# Patient Record
Sex: Female | Born: 1955 | Race: White | Hispanic: No | Marital: Married | Smoking: Never smoker
Health system: Southern US, Community
[De-identification: ages and names within clinical notes are randomized; demographics above are authoritative.]

## PROBLEM LIST (undated history)

## (undated) DIAGNOSIS — F419 Anxiety disorder, unspecified: Secondary | ICD-10-CM

## (undated) DIAGNOSIS — T7840XA Allergy, unspecified, initial encounter: Secondary | ICD-10-CM

## (undated) DIAGNOSIS — M1990 Unspecified osteoarthritis, unspecified site: Secondary | ICD-10-CM

## (undated) HISTORY — PX: KNEE ARTHROSCOPY: SUR90

## (undated) HISTORY — DX: Allergy, unspecified, initial encounter: T78.40XA

---

## 2006-06-01 ENCOUNTER — Encounter: Admission: RE | Admit: 2006-06-01 | Discharge: 2006-06-01 | Payer: Self-pay | Admitting: Internal Medicine

## 2007-06-03 ENCOUNTER — Encounter: Admission: RE | Admit: 2007-06-03 | Discharge: 2007-06-03 | Payer: Self-pay | Admitting: Family Medicine

## 2009-04-23 ENCOUNTER — Encounter: Admission: RE | Admit: 2009-04-23 | Discharge: 2009-04-23 | Payer: Self-pay | Admitting: Family Medicine

## 2009-12-31 ENCOUNTER — Ambulatory Visit: Payer: Self-pay | Admitting: Diagnostic Radiology

## 2009-12-31 ENCOUNTER — Encounter: Payer: Self-pay | Admitting: Emergency Medicine

## 2009-12-31 ENCOUNTER — Observation Stay (HOSPITAL_COMMUNITY)
Admission: EM | Admit: 2009-12-31 | Discharge: 2010-01-01 | Payer: Self-pay | Source: Home / Self Care | Admitting: Family Medicine

## 2009-12-31 ENCOUNTER — Ambulatory Visit: Payer: Self-pay | Admitting: Cardiology

## 2009-12-31 ENCOUNTER — Ambulatory Visit: Payer: Self-pay | Admitting: Family Medicine

## 2010-01-01 ENCOUNTER — Encounter: Payer: Self-pay | Admitting: Family Medicine

## 2010-01-01 ENCOUNTER — Ambulatory Visit: Payer: Self-pay | Admitting: Oncology

## 2010-06-03 ENCOUNTER — Encounter: Admission: RE | Admit: 2010-06-03 | Discharge: 2010-06-03 | Payer: Self-pay | Admitting: Family Medicine

## 2010-09-14 LAB — CBC
HCT: 34.8 % — ABNORMAL LOW (ref 36.0–46.0)
HCT: 37.2 % (ref 36.0–46.0)
MCH: 29.5 pg (ref 26.0–34.0)
MCHC: 34.5 g/dL (ref 30.0–36.0)
MCV: 89.4 fL (ref 78.0–100.0)
RBC: 4.16 MIL/uL (ref 3.87–5.11)
RDW: 13.6 % (ref 11.5–15.5)
WBC: 11.9 10*3/uL — ABNORMAL HIGH (ref 4.0–10.5)

## 2010-09-14 LAB — BASIC METABOLIC PANEL
CO2: 26 mEq/L (ref 19–32)
Chloride: 106 mEq/L (ref 96–112)
GFR calc Af Amer: >60 mL/min (ref >60)
Potassium: 4.1 mEq/L (ref 3.5–5.1)

## 2010-09-14 LAB — CARDIAC PANEL(CRET KIN+CKTOT+MB+TROPI)
CK, MB: 0.8 ng/mL (ref 0.3–4.0)
Relative Index: INVALID (ref 0.0–2.5)
Total CK: 56 U/L (ref 7–177)
Troponin I: 0.01 ng/mL (ref 0.00–0.06)

## 2010-09-14 LAB — TSH: TSH: 1.926 u[IU]/mL (ref 0.350–4.500)

## 2010-09-14 LAB — COMPREHENSIVE METABOLIC PANEL
ALT: 21 U/L (ref 0–35)
BUN: 8 mg/dL (ref 6–23)
Calcium: 8.4 mg/dL (ref 8.4–10.5)
Glucose, Bld: 98 mg/dL (ref 70–99)
Sodium: 141 mEq/L (ref 135–145)
Total Protein: 6.9 g/dL (ref 6.0–8.3)

## 2010-09-14 LAB — URINALYSIS, ROUTINE W REFLEX MICROSCOPIC
Bilirubin Urine: NEGATIVE
Glucose, UA: NEGATIVE mg/dL
Ketones, ur: NEGATIVE mg/dL
Leukocytes, UA: NEGATIVE
pH: 5 (ref 5.0–8.0)

## 2010-09-14 LAB — URINE MICROSCOPIC-ADD ON

## 2010-09-14 LAB — DIFFERENTIAL
Eosinophils Relative: 2 % (ref 0–5)
Lymphocytes Relative: 24 % (ref 12–46)
Lymphs Abs: 2.9 10*3/uL (ref 0.7–4.0)
Monocytes Absolute: 0.9 10*3/uL (ref 0.1–1.0)
Monocytes Relative: 8 % (ref 3–12)

## 2010-09-14 LAB — HEMOGLOBIN A1C
Hgb A1c MFr Bld: 5.8 % — ABNORMAL HIGH (ref <5.7)
Mean Plasma Glucose: 120 mg/dL — ABNORMAL HIGH (ref <117)

## 2010-09-14 LAB — LIPID PANEL
LDL Cholesterol: 107 mg/dL — ABNORMAL HIGH (ref 0–99)
Triglycerides: 65 mg/dL (ref <150)

## 2013-07-31 ENCOUNTER — Ambulatory Visit (INDEPENDENT_AMBULATORY_CARE_PROVIDER_SITE_OTHER): Payer: BC Managed Care – PPO | Admitting: Family Medicine

## 2013-07-31 VITALS — BP 144/82 | HR 90 | Temp 98.2°F | Resp 18 | Ht 62.0 in | Wt 222.0 lb

## 2013-07-31 DIAGNOSIS — F411 Generalized anxiety disorder: Secondary | ICD-10-CM

## 2013-07-31 DIAGNOSIS — IMO0001 Reserved for inherently not codable concepts without codable children: Secondary | ICD-10-CM

## 2013-07-31 DIAGNOSIS — R030 Elevated blood-pressure reading, without diagnosis of hypertension: Secondary | ICD-10-CM

## 2013-07-31 DIAGNOSIS — R002 Palpitations: Secondary | ICD-10-CM

## 2013-07-31 MED ORDER — ALPRAZOLAM 0.25 MG PO TABS: 0.2500 mg | ORAL_TABLET | Freq: Two times a day (BID) | ORAL | Status: DC | PRN

## 2013-07-31 NOTE — Patient Instructions (Signed)

## 2013-07-31 NOTE — Progress Notes (Signed)
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  Under stress lately at work, divorced on second marriage, 3 children (conflicts with current husband).  No h/o hypertension or palpitations previously.  No focal weakness  Objective:  Alert, relaxed and in NAD.  Tearful when talking about family.  Excited about new responsibilities at work BP 130/86 Moving 4 extremities normal Neck:  Supple Chest:  Clear Heart: reg without murmur or gallop Skin:  No rash  Assessment:  Anxiety, worried about family  Plan:  Palpitations  Elevated blood pressure  Anxiety state, unspecified - Plan: ALPRAZolam (XANAX) 0.25 MG tablet  Xanax .25 bid prn #60 no refill Follow up with PCP on Wednesday.  Dr. Zachery DauerBarnes  Signed, Elvina SidleKurt Nivan Melendrez, MD

## 2013-08-02 ENCOUNTER — Other Ambulatory Visit (HOSPITAL_COMMUNITY)
Admission: RE | Admit: 2013-08-02 | Discharge: 2013-08-02 | Disposition: A | Discharge status: Home / Self Care | Payer: BC Managed Care – PPO | Source: Ambulatory Visit | Attending: Family Medicine | Admitting: Family Medicine

## 2013-08-02 ENCOUNTER — Other Ambulatory Visit: Payer: Self-pay | Admitting: Family Medicine

## 2013-08-02 DIAGNOSIS — Z124 Encounter for screening for malignant neoplasm of cervix: Secondary | ICD-10-CM | POA: Insufficient documentation

## 2013-10-12 ENCOUNTER — Ambulatory Visit: Payer: BC Managed Care – PPO

## 2013-10-12 ENCOUNTER — Ambulatory Visit (INDEPENDENT_AMBULATORY_CARE_PROVIDER_SITE_OTHER): Payer: BC Managed Care – PPO | Admitting: Emergency Medicine

## 2013-10-12 VITALS — BP 140/82 | HR 90 | Temp 98.6°F | Resp 16 | Ht 62.0 in | Wt 224.0 lb

## 2013-10-12 DIAGNOSIS — M25569 Pain in unspecified knee: Secondary | ICD-10-CM

## 2013-10-12 LAB — POCT CBC
GRANULOCYTE PERCENT: 64 % (ref 37–80)
HEMATOCRIT: 44.6 % (ref 37.7–47.9)
HEMOGLOBIN: 14.7 g/dL (ref 12.2–16.2)
Lymph, poc: 3.4 (ref 0.6–3.4)
MCH, POC: 29.6 pg (ref 27–31.2)
MCHC: 33 g/dL (ref 31.8–35.4)
MCV: 90 fL (ref 80–97)
MID (cbc): 0.7 (ref 0–0.9)
MPV: 9.1 fL (ref 0–99.8)
POC GRANULOCYTE: 7.3 — AB (ref 2–6.9)
POC LYMPH PERCENT: 29.7 %L (ref 10–50)
POC MID %: 6.3 % (ref 0–12)
Platelet Count, POC: 413 10*3/uL (ref 142–424)
RBC: 4.96 M/uL (ref 4.04–5.48)
RDW, POC: 13.5 %
WBC: 11.4 10*3/uL — AB (ref 4.6–10.2)

## 2013-10-12 LAB — POCT SEDIMENTATION RATE: POCT SED RATE: 63 mm/h — AB (ref 0–22)

## 2013-10-12 MED ORDER — MELOXICAM 15 MG PO TABS: 15.0000 mg | ORAL_TABLET | Freq: Every day | ORAL | Status: DC

## 2013-10-12 NOTE — Progress Notes (Signed)
[facility] [address], [place] [id number] [phone]  Date:  10/12/2013   Name:  Brenda Bishop   DOB:  Aug 03, 1955   MRN:  [id number]  PCP:  Dois DavenportICHTER,KAREN L., MD    Chief Complaint: Leg Pain   History of Present Illness:  Brenda Bishop is a 58 y.o. very pleasant female patient who presents with the following:  1 week duration pain in legs mostly knee.  Left knee hurts less.  No history of injury or overuse.  No history of DJD or arthritis.  No fever or chills.  No redness or swelling of knees.  No crepitus or locking. Started on zoloft and aggressively increased to 100 mg daily before joint pain started but no antecedent infections.  No improvement with over the counter medications or other home remedies. Denies other complaint or health concern today.   There are no active problems to display for this patient.   Past Medical History  Diagnosis Date  . Allergy     Past Surgical History  Procedure Laterality Date  . Cesarean section      History  Substance Use Topics  . Smoking status: Never Smoker   . Smokeless tobacco: Not on file  . Alcohol Use: Not on file    Family History  Problem Relation Age of Onset  . Cancer Mother   . COPD Mother   . Diabetes Father   . Heart disease Father   . Hyperlipidemia Father   . Hypertension Father   . Stroke Father     Allergies  Allergen Reactions  . Penicillins     Medication list has been reviewed and updated.  Current Outpatient Prescriptions on File Prior to Visit  Medication Sig Dispense Refill  . ALPRAZolam (XANAX) 0.25 MG tablet Take 1 tablet (0.25 mg total) by mouth 2 (two) times daily as needed for anxiety.  60 tablet  0   No current facility-administered medications on file prior to visit.    Review of Systems:  As per HPI, otherwise negative.    Physical Examination: Filed Vitals:   10/12/13 1313  BP: 140/82  Pulse: 90  Temp: 98.6 F (37 C)  Resp: 16   Filed Vitals:    10/12/13 1313  Height: 5\' 2"  (1.575 m)  Weight: 224 lb (101.606 kg)   Body mass index is 40.96 kg/(m^2). Ideal Body Weight: Weight in (lb) to have BMI = 25: 136.4  GEN: WDWN, NAD, Non-toxic, Alert & Oriented x 3 HEENT: Atraumatic, Normocephalic.  Ears and Nose: No external deformity. EXTR: No clubbing/cyanosis/edema NEURO: Normal gait.  PSYCH: Normally interactive. Conversant. Not depressed or anxious appearing.  Calm demeanor.  KNEE:  Mild tenderness and warmth bilaterally full ROM.  No cellulitis or erythema.    Assessment and Plan:  DJD knees mobic  Signed,  Phillips OdorJeffery Amelia Macken, MD   Results for orders placed in visit on 10/12/13  POCT CBC      Result Value Ref Range   WBC 11.4 (*) 4.6 - 10.2 K/uL   Lymph, poc 3.4  0.6 - 3.4   POC LYMPH PERCENT 29.7  10 - 50 %L   MID (cbc) 0.7  0 - 0.9   POC MID % 6.3  0 - 12 %M   POC Granulocyte 7.3 (*) 2 - 6.9   Granulocyte percent 64.0  37 - 80 %G   RBC 4.96  4.04 - 5.48 M/uL   Hemoglobin 14.7  12.2 - 16.2 g/dL  HCT, POC 44.6  37.7 - 47.9 %   MCV 90.0  80 - 97 fL   MCH, POC 29.6  27 - 31.2 pg   MCHC 33.0  31.8 - 35.4 g/dL   RDW, POC 16.113.5     Platelet Count, POC 413  142 - 424 K/uL   MPV 9.1  0 - 99.8 fL    [facility] reading (PRIMARY) by  Dr. Dareen PianoAnderson.  Negative but for mild DJD.

## 2013-10-16 ENCOUNTER — Telehealth: Payer: Self-pay

## 2013-10-16 NOTE — Telephone Encounter (Signed)
Patient is needing her x-rays by 2:30 today

## 2013-10-16 NOTE — Telephone Encounter (Addendum)
Patient called stated she has an appointment today to see the orthopedics at 3:15. She is requesting a copy of her x-ray. [phone].( Knee)

## 2013-10-16 NOTE — Telephone Encounter (Signed)
Copy available in pu drawer- pt notified.

## 2014-11-26 ENCOUNTER — Encounter (HOSPITAL_COMMUNITY): Payer: Self-pay

## 2014-11-26 ENCOUNTER — Emergency Department (HOSPITAL_COMMUNITY)
Admission: EM | Admit: 2014-11-26 | Discharge: 2014-11-26 | Disposition: A | Discharge status: Home / Self Care | Payer: BLUE CROSS/BLUE SHIELD | Attending: Emergency Medicine | Admitting: Emergency Medicine

## 2014-11-26 ENCOUNTER — Emergency Department (HOSPITAL_COMMUNITY): Payer: BLUE CROSS/BLUE SHIELD

## 2014-11-26 DIAGNOSIS — Z79899 Other long term (current) drug therapy: Secondary | ICD-10-CM | POA: Insufficient documentation

## 2014-11-26 DIAGNOSIS — R002 Palpitations: Secondary | ICD-10-CM | POA: Insufficient documentation

## 2014-11-26 DIAGNOSIS — F419 Anxiety disorder, unspecified: Secondary | ICD-10-CM | POA: Diagnosis not present

## 2014-11-26 DIAGNOSIS — Z880 Allergy status to penicillin: Secondary | ICD-10-CM | POA: Diagnosis not present

## 2014-11-26 LAB — CBC WITH DIFFERENTIAL/PLATELET
Basophils Absolute: 0.1 10*3/uL (ref 0.0–0.1)
Basophils Relative: 1 % (ref 0–1)
Eosinophils Absolute: 0.3 10*3/uL (ref 0.0–0.7)
Eosinophils Relative: 3 % (ref 0–5)
HEMATOCRIT: 37.4 % (ref 36.0–46.0)
HEMOGLOBIN: 12.3 g/dL (ref 12.0–15.0)
LYMPHS PCT: 26 % (ref 12–46)
Lymphs Abs: 3.2 10*3/uL (ref 0.7–4.0)
MCH: 29.2 pg (ref 26.0–34.0)
MCHC: 32.9 g/dL (ref 30.0–36.0)
MCV: 88.8 fL (ref 78.0–100.0)
MONOS PCT: 10 % (ref 3–12)
Monocytes Absolute: 1.2 10*3/uL — ABNORMAL HIGH (ref 0.1–1.0)
NEUTROS ABS: 7.5 10*3/uL (ref 1.7–7.7)
NEUTROS PCT: 60 % (ref 43–77)
PLATELETS: 318 10*3/uL (ref 150–400)
RBC: 4.21 MIL/uL (ref 3.87–5.11)
RDW: 13.5 % (ref 11.5–15.5)
WBC: 12.3 10*3/uL — AB (ref 4.0–10.5)

## 2014-11-26 LAB — I-STAT TROPONIN, ED: Troponin i, poc: 0 ng/mL (ref 0.00–0.08)

## 2014-11-26 LAB — I-STAT CHEM 8, ED
BUN: 13 mg/dL (ref 6–20)
Calcium, Ion: 1.09 mmol/L — ABNORMAL LOW (ref 1.12–1.23)
Chloride: 105 mmol/L (ref 101–111)
Creatinine, Ser: 0.8 mg/dL (ref 0.44–1.00)
Glucose, Bld: 103 mg/dL — ABNORMAL HIGH (ref 65–99)
HCT: 39 % (ref 36.0–46.0)
Hemoglobin: 13.3 g/dL (ref 12.0–15.0)
Potassium: 3.6 mmol/L (ref 3.5–5.1)
Sodium: 140 mmol/L (ref 135–145)
TCO2: 22 mmol/L (ref 0–100)

## 2014-11-26 NOTE — ED Notes (Signed)
MD at bedside. 

## 2014-11-26 NOTE — Discharge Instructions (Signed)

## 2014-11-26 NOTE — ED Provider Notes (Signed)
CSN: [id number]     Arrival date & time 11/26/14  1644 History   First MD Initiated Contact with Patient 11/26/14 1750     Chief Complaint  Patient presents with  . Palpitations     (Consider location/radiation/quality/duration/timing/severity/associated sxs/prior Treatment) The history is provided by the patient.   patient had a feeling of palpitations today. Began while she was at work. She has had some stress at work. Did not feel lightheaded or dizzy. States she was more aware of her eating. States that another nurse felt her pulse and thought she may need to come to the ER. No chest pain. No trouble breathing. No numbness weakness. No weight loss. No previous history of thyroid problems. No heavy caffeine or stimulant intake. She says she has had some similar episodes in the past was told that it was stress.  Past Medical History  Diagnosis Date  . Allergy    Past Surgical History  Procedure Laterality Date  . Cesarean section    . Knee arthroscopy Left    Family History  Problem Relation Age of Onset  . Cancer Mother   . COPD Mother   . Diabetes Father   . Heart disease Father   . Hyperlipidemia Father   . Hypertension Father   . Stroke Father    History  Substance Use Topics  . Smoking status: Never Smoker   . Smokeless tobacco: Not on file  . Alcohol Use: Yes     Comment: occ   OB History    No data available     Review of Systems  Constitutional: Negative for activity change and appetite change.  Eyes: Negative for pain.  Respiratory: Negative for chest tightness and shortness of breath.   Cardiovascular: Positive for palpitations. Negative for chest pain and leg swelling.  Gastrointestinal: Negative for nausea, vomiting, abdominal pain and diarrhea.  Genitourinary: Negative for flank pain.  Musculoskeletal: Negative for back pain and neck stiffness.  Skin: Negative for rash.  Neurological: Negative for weakness, numbness and headaches.   Psychiatric/Behavioral: Negative for behavioral problems. The patient is nervous/anxious.       Allergies  Penicillins  Home Medications   Prior to Admission medications   Medication Sig Start Date End Date Taking? Authorizing Provider  diphenhydramine-acetaminophen (TYLENOL PM) 25-500 MG TABS Take 1 tablet by mouth at bedtime as needed (sleep).   Yes Historical Provider, MD  etodolac (LODINE) 400 MG tablet Take 400 mg by mouth daily.   Yes Historical Provider, MD  LORazepam (ATIVAN) 0.5 MG tablet Take 0.25-0.5 mg by mouth every 4 (four) hours as needed for anxiety.  11/15/14  Yes Historical Provider, MD  ALPRAZolam Prudy Feeler) 0.25 MG tablet Take 1 tablet (0.25 mg total) by mouth 2 (two) times daily as needed for anxiety. Patient not taking: Reported on 11/26/2014 07/31/13   Elvina Sidle, MD  meloxicam (MOBIC) 15 MG tablet Take 1 tablet (15 mg total) by mouth daily. Patient not taking: Reported on 11/26/2014 10/12/13   Carmelina Dane, MD   BP 138/77 mmHg  Pulse 77  Temp(Src) 98.1 F (36.7 C) (Oral)  Resp 18  SpO2 95% Physical Exam  Constitutional: She is oriented to person, place, and time. She appears well-developed and well-nourished.  HENT:  Head: Normocephalic and atraumatic.  Eyes: Pupils are equal, round, and reactive to light.  Neck: Normal range of motion. Neck supple. No thyromegaly present.  Cardiovascular: Normal rate, regular rhythm and normal heart sounds.   No murmur heard. Pulmonary/Chest: Effort  normal and breath sounds normal. No respiratory distress. She has no wheezes. She has no rales.  Abdominal: Soft. Bowel sounds are normal. She exhibits no distension. There is no tenderness. There is no rebound and no guarding.  Musculoskeletal: Normal range of motion.  Neurological: She is alert and oriented to person, place, and time. No cranial nerve deficit.  Skin: Skin is warm and dry.  Psychiatric: She has a normal mood and affect. Her speech is normal.  Nursing  note and vitals reviewed.   ED Course  Procedures (including critical care time) Labs Review Labs Reviewed  CBC WITH DIFFERENTIAL/PLATELET - Abnormal; Notable for the following:    WBC 12.3 (*)    Monocytes Absolute 1.2 (*)    All other components within normal limits  I-STAT CHEM 8, ED - Abnormal; Notable for the following:    Glucose, Bld 103 (*)    Calcium, Ion 1.09 (*)    All other components within normal limits  Rosezena SensorI-STAT TROPOININ, ED    Imaging Review Dg Chest 2 View  11/26/2014   CLINICAL DATA:  Palpitations.  EXAM: CHEST  2 VIEW  COMPARISON:  None.  FINDINGS: The heart size and mediastinal contours are within normal limits. Both lungs are clear. The visualized skeletal structures are unremarkable.  IMPRESSION: No active cardiopulmonary disease.   Electronically Signed   By: Elberta Fortisaniel  Boyle M.D.   On: 11/26/2014 18:38     EKG Interpretation   Date/Time:  Monday Nov 26 2014 17:02:13 EDT Ventricular Rate:  97 PR Interval:  144 QRS Duration: 86 QT Interval:  354 QTC Calculation: 450 R Axis:   17 Text Interpretation:  Sinus rhythm Probable left atrial enlargement RSR'  in V1 or V2, probably normal variant No significant change since last  tracing Confirmed by Rubin PayorPICKERING  MD, Harrold DonathNATHAN [phone]([id number] on 11/26/2014 6:00:36 PM      MDM   Final diagnoses:  Palpitations    Patient with palpitations. Sinus here. No PVCs. Lab work reassuring and will need follow-up with her PCP or cardiology.    Benjiman CoreNathan Raheem Kolbe, MD 11/27/14 [phone]

## 2014-11-26 NOTE — ED Notes (Signed)
Pt c/o palpitations starting this morning while she was at work.  Denies pain.  Denies n/v/d.  Pt reports similar episode previously.  Sts "I just chalked it up to stress."

## 2015-06-04 ENCOUNTER — Other Ambulatory Visit: Payer: Self-pay

## 2015-06-04 DIAGNOSIS — Z1231 Encounter for screening mammogram for malignant neoplasm of breast: Secondary | ICD-10-CM

## 2015-06-12 ENCOUNTER — Ambulatory Visit: Payer: TRICARE For Life (TFL) | Admitting: Allergy and Immunology

## 2015-07-02 ENCOUNTER — Ambulatory Visit

## 2015-09-27 ENCOUNTER — Other Ambulatory Visit (HOSPITAL_COMMUNITY)
Admission: RE | Admit: 2015-09-27 | Discharge: 2015-09-27 | Disposition: A | Discharge status: Home / Self Care | Source: Ambulatory Visit | Attending: Family Medicine | Admitting: Family Medicine

## 2015-09-27 ENCOUNTER — Other Ambulatory Visit: Payer: Self-pay | Admitting: Family Medicine

## 2015-09-27 DIAGNOSIS — Z1151 Encounter for screening for human papillomavirus (HPV): Secondary | ICD-10-CM | POA: Diagnosis not present

## 2015-09-27 DIAGNOSIS — Z01419 Encounter for gynecological examination (general) (routine) without abnormal findings: Secondary | ICD-10-CM | POA: Insufficient documentation

## 2015-09-30 LAB — CYTOLOGY - PAP

## 2015-09-30 IMAGING — CR DG CHEST 2V
2 series · 2 of 2 positions shown · non-contrast
Comparison: None.

CLINICAL DATA: Palpitations.

EXAM:
CHEST  2 VIEW

[w chest pa]
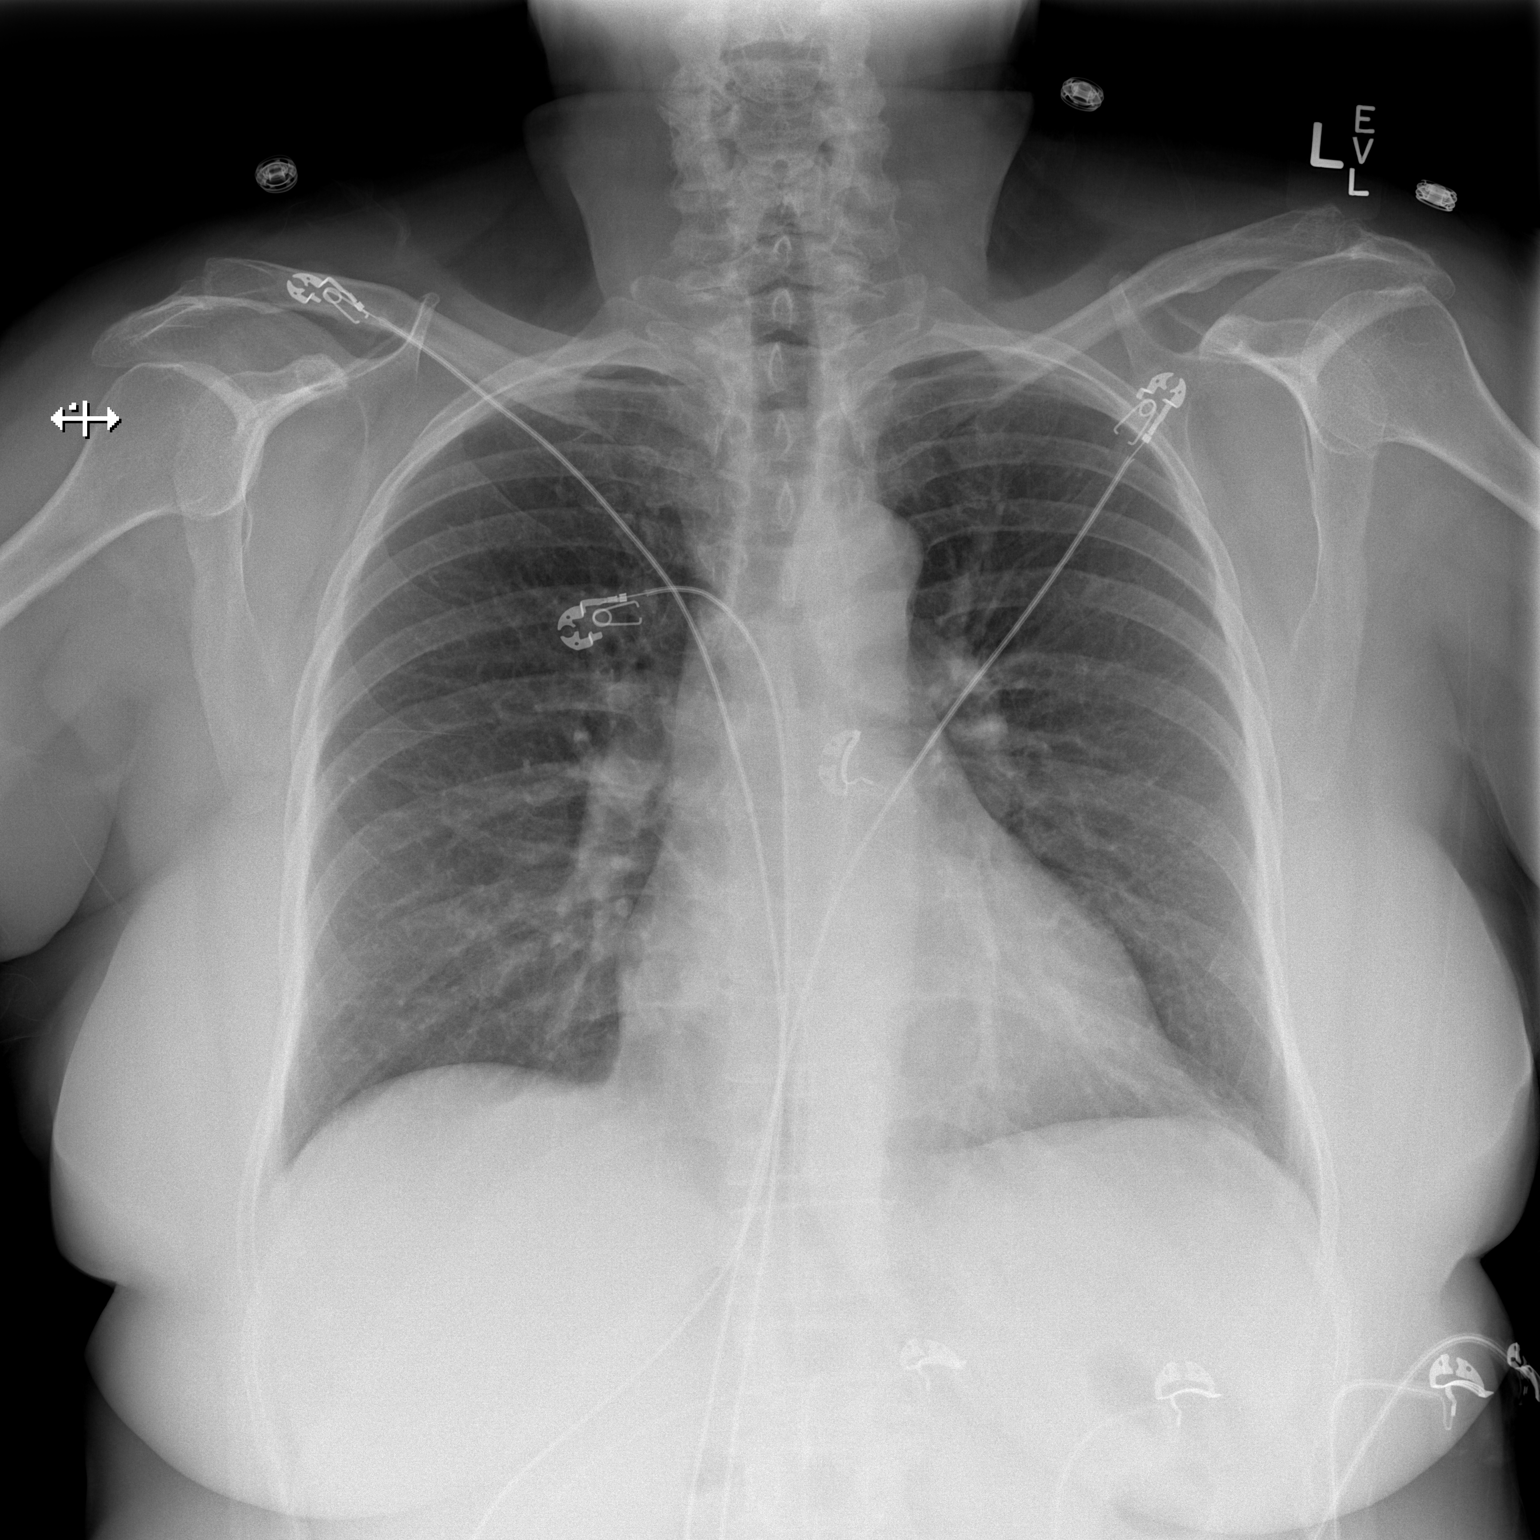

[w chest lat]
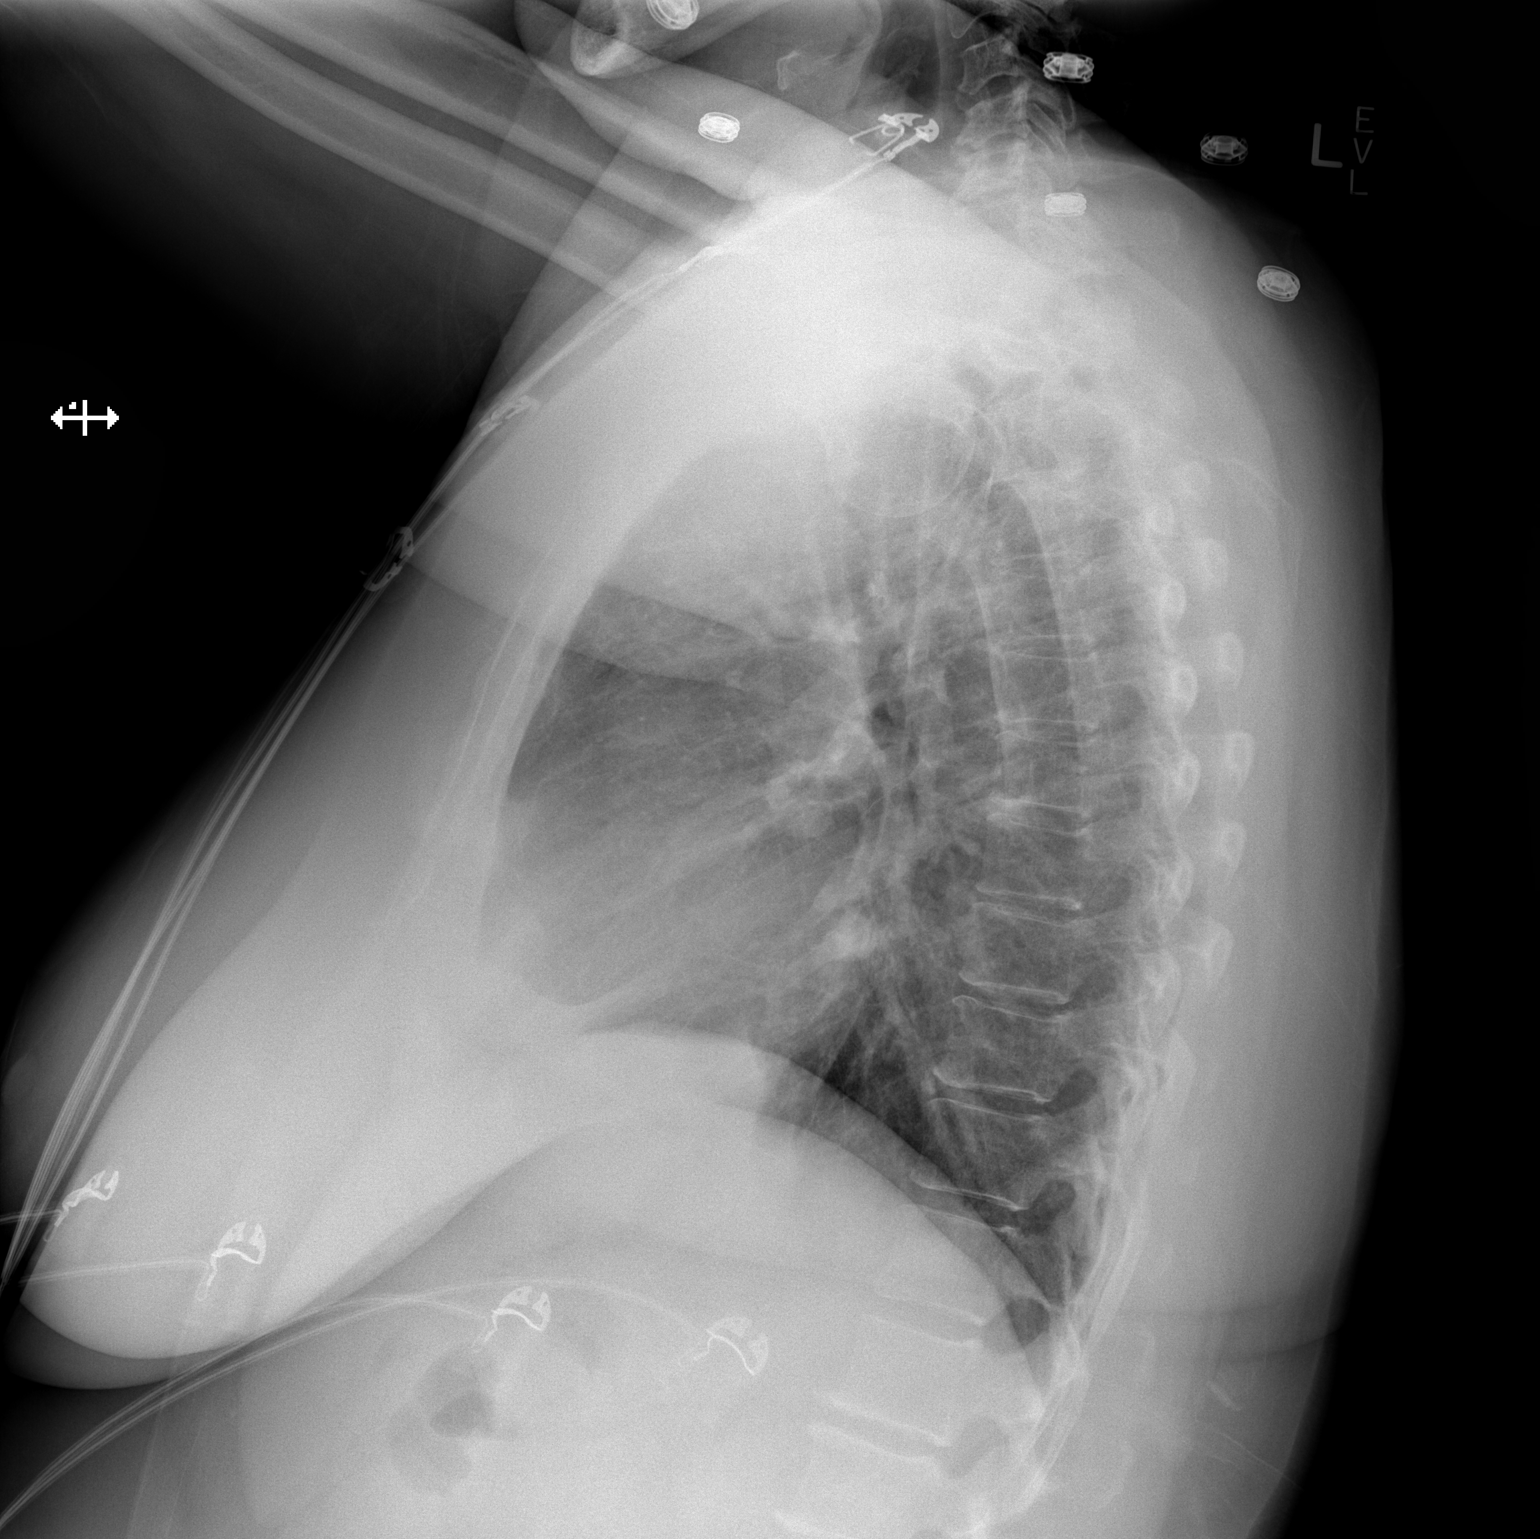

[2 of 2 positions shown; findings below may reference images not displayed]

FINDINGS: The heart size and mediastinal contours are within normal limits.
Both lungs are clear. The visualized skeletal structures are
unremarkable.
IMPRESSION: No active cardiopulmonary disease.

## 2015-12-23 ENCOUNTER — Other Ambulatory Visit: Payer: Self-pay | Admitting: Family Medicine

## 2015-12-23 DIAGNOSIS — Z1231 Encounter for screening mammogram for malignant neoplasm of breast: Secondary | ICD-10-CM

## 2015-12-24 ENCOUNTER — Ambulatory Visit
Admission: RE | Admit: 2015-12-24 | Discharge: 2015-12-24 | Disposition: A | Discharge status: Home / Self Care | Source: Ambulatory Visit | Attending: Family Medicine | Admitting: Family Medicine

## 2015-12-24 ENCOUNTER — Other Ambulatory Visit: Payer: Self-pay | Admitting: Family Medicine

## 2015-12-24 DIAGNOSIS — Z1231 Encounter for screening mammogram for malignant neoplasm of breast: Secondary | ICD-10-CM

## 2015-12-30 ENCOUNTER — Other Ambulatory Visit: Payer: Self-pay | Admitting: Family Medicine

## 2015-12-30 DIAGNOSIS — R928 Other abnormal and inconclusive findings on diagnostic imaging of breast: Secondary | ICD-10-CM

## 2016-01-08 ENCOUNTER — Encounter

## 2016-01-09 ENCOUNTER — Encounter

## 2016-01-10 ENCOUNTER — Ambulatory Visit
Admission: RE | Admit: 2016-01-10 | Discharge: 2016-01-10 | Disposition: A | Discharge status: Home / Self Care | Source: Ambulatory Visit | Attending: Family Medicine | Admitting: Family Medicine

## 2016-01-10 DIAGNOSIS — R928 Other abnormal and inconclusive findings on diagnostic imaging of breast: Secondary | ICD-10-CM

## 2016-07-31 ENCOUNTER — Other Ambulatory Visit: Payer: Self-pay | Admitting: Family Medicine

## 2016-07-31 DIAGNOSIS — R921 Mammographic calcification found on diagnostic imaging of breast: Secondary | ICD-10-CM

## 2016-08-11 ENCOUNTER — Encounter

## 2016-08-25 ENCOUNTER — Ambulatory Visit
Admission: RE | Admit: 2016-08-25 | Discharge: 2016-08-25 | Disposition: A | Discharge status: Home / Self Care | Source: Ambulatory Visit | Attending: Family Medicine | Admitting: Family Medicine

## 2016-08-25 DIAGNOSIS — R921 Mammographic calcification found on diagnostic imaging of breast: Secondary | ICD-10-CM

## 2016-12-10 ENCOUNTER — Other Ambulatory Visit: Payer: Self-pay | Admitting: Nurse Practitioner

## 2017-01-26 ENCOUNTER — Other Ambulatory Visit: Payer: Self-pay | Admitting: Obstetrics and Gynecology

## 2017-02-11 NOTE — Patient Instructions (Addendum)
Your procedure is scheduled on: Tuesday February 16, 2017 at 7:30 am  [facility] at: 6 am   Pick up the phone at the desk and dial [phone].  Call this number if you have problems the morning of surgery: [phone].  Remember: Do NOT eat food or drink any fluids after: Midnight on Sunday August 20 Do NOT drink clear liquids after: Take these medicines the morning of surgery with a SIP OF WATER: None  Do NOT wear jewelry (body piercing), metal hair clips/bobby pins, make-up, or nail polish. Do NOT wear lotions, powders, or perfumes.  You may wear deoderant. Do NOT shave for 48 hours prior to surgery. Do NOT bring valuables to the hospital. Contacts, dLentures, or bridgework may not be worn into surgery.  Have a responsible adult drive you home and stay with you for 24 hours after your procedure

## 2017-02-12 ENCOUNTER — Encounter (HOSPITAL_COMMUNITY): Payer: Self-pay

## 2017-02-12 ENCOUNTER — Other Ambulatory Visit: Payer: Self-pay

## 2017-02-12 ENCOUNTER — Encounter (HOSPITAL_COMMUNITY)
Admission: RE | Admit: 2017-02-12 | Discharge: 2017-02-12 | Disposition: A | Discharge status: Home / Self Care | Source: Ambulatory Visit | Attending: Obstetrics and Gynecology | Admitting: Obstetrics and Gynecology

## 2017-02-12 DIAGNOSIS — Z029 Encounter for administrative examinations, unspecified: Secondary | ICD-10-CM | POA: Diagnosis not present

## 2017-02-12 HISTORY — DX: Anxiety disorder, unspecified: F41.9

## 2017-02-12 HISTORY — DX: Unspecified osteoarthritis, unspecified site: M19.90

## 2017-02-12 LAB — CBC
HEMATOCRIT: 40.2 % (ref 36.0–46.0)
Hemoglobin: 13.1 g/dL (ref 12.0–15.0)
MCH: 29.2 pg (ref 26.0–34.0)
MCHC: 32.6 g/dL (ref 30.0–36.0)
MCV: 89.5 fL (ref 78.0–100.0)
Platelets: 347 10*3/uL (ref 150–400)
RBC: 4.49 MIL/uL (ref 3.87–5.11)
RDW: 14.4 % (ref 11.5–15.5)
WBC: 11.4 10*3/uL — AB (ref 4.0–10.5)

## 2017-02-12 NOTE — Pre-Procedure Instructions (Signed)
DR. Sandford Craze VIEWED EKG AND OKAY'D

## 2017-02-15 NOTE — Anesthesia Preprocedure Evaluation (Addendum)
Anesthesia Evaluation  Patient identified by MRN, date of birth, ID band Patient awake    Reviewed: Allergy & Precautions, NPO status , Patient's Chart, lab work & pertinent test results  Airway Mallampati: III  TM Distance: >3 FB Neck ROM: Full    Dental no notable dental hx. (+) Dental Advisory Given   Pulmonary neg pulmonary ROS,    Pulmonary exam normal        Cardiovascular negative cardio ROS Normal cardiovascular exam     Neuro/Psych Anxiety negative neurological ROS     GI/Hepatic negative GI ROS, Neg liver ROS,   Endo/Other  Morbid obesity  Renal/GU negative Renal ROS     Musculoskeletal negative musculoskeletal ROS (+)   Abdominal   Peds  Hematology negative hematology ROS (+)   Anesthesia Other Findings Day of surgery medications reviewed with the patient.  Reproductive/Obstetrics                            Anesthesia Physical Anesthesia Plan  ASA: III  Anesthesia Plan: General   Post-op Pain Management:    Induction:   PONV Risk Score and Plan: 4 or greater and Ondansetron, Dexamethasone, Scopolamine patch - Pre-op and Diphenhydramine  Airway Management Planned: LMA and Oral ETT  Additional Equipment:   Intra-op Plan:   Post-operative Plan: Extubation in OR  Informed Consent: I have reviewed the patients History and Physical, chart, labs and discussed the procedure including the risks, benefits and alternatives for the proposed anesthesia with the patient or authorized representative who has indicated his/her understanding and acceptance.   Dental advisory given  Plan Discussed with: CRNA, Anesthesiologist and Surgeon  Anesthesia Plan Comments:        Anesthesia Quick Evaluation

## 2017-02-15 NOTE — H&P (Signed)
Brenda Bishop is an 61 y.o. Female who presents for further evaluation of PMB which occurred in 11/2016.  Pertinent Gynecological History: Menses: post-menopausal Bleeding: post menopausal bleeding Contraception: tubal ligation DES exposure: unknown Blood transfusions: none Sexually transmitted diseases: no past history Previous GYN Procedures: DNC  Last mammogram: calcification being followed at 6 month ingervals, last done 07/2016, due 01/2017 Date:07/2016 Last pap: normal Date: 12/2016 Neg HR HPV OB History: G3, P3   Menstrual History: Menarche age: 25 No LMP recorded. Patient is postmenopausal.    Past Medical History:  Diagnosis Date  . Allergy   . Anxiety   . Arthritis    bilateral knees    Past Surgical History:  Procedure Laterality Date  . CESAREAN SECTION    . KNEE ARTHROSCOPY Left     Family History  Problem Relation Age of Onset  . Cancer Mother   . COPD Mother   . Diabetes Father   . Heart disease Father   . Hyperlipidemia Father   . Hypertension Father   . Stroke Father     Social History:  reports that she has never smoked. She has never used smokeless tobacco. She reports that she drinks alcohol. She reports that she does not use drugs.  Allergies:  Allergies  Allergen Reactions  . Penicillins Rash    Has patient had a PCN reaction causing immediate rash, facial/tongue/throat swelling, SOB or lightheadedness with hypotension: No Has patient had a PCN reaction causing severe rash involving mucus membranes or skin necrosis: No Has patient had a PCN reaction that required hospitalization: No Has patient had a PCN reaction occurring within the last 10 years: No If all of the above answers are "NO", then may proceed with Cephalosporin use.     Prescriptions Prior to Admission  Medication Sig Dispense Refill Last Dose  . diphenhydramine-acetaminophen (TYLENOL PM) 25-500 MG TABS Take 2 tablets by mouth at bedtime.    02/15/2017 at Unknown time  .  escitalopram (LEXAPRO) 10 MG tablet Take 10 mg by mouth at bedtime.   02/15/2017 at Unknown time  . mirtazapine (REMERON) 15 MG tablet Take 15 mg by mouth at bedtime.   02/15/2017 at Unknown time  . ALPRAZolam (XANAX) 0.25 MG tablet Take 1 tablet (0.25 mg total) by mouth 2 (two) times daily as needed for anxiety. (Patient not taking: Reported on 11/26/2014) 60 tablet 0 Not Taking at Unknown time  . meloxicam (MOBIC) 15 MG tablet Take 1 tablet (15 mg total) by mouth daily. (Patient not taking: Reported on 11/26/2014) 30 tablet 2 Not Taking at Unknown time    ROS  Blood pressure (!) 168/79, pulse 85, temperature 98.2 F (36.8 C), temperature source Oral, resp. rate 16, SpO2 95 %. Physical Exam  Vitals reviewed. Constitutional: She is oriented to person, place, and time. She appears well-developed and well-nourished.  HENT:  Head: Normocephalic and atraumatic.  Eyes: EOM are normal.  Neck: Normal range of motion. Neck supple.  Cardiovascular: Normal rate, regular rhythm and normal heart sounds.   Respiratory: Effort normal and breath sounds normal.  GI: Soft. Bowel sounds are normal.  Genitourinary:  Genitourinary Comments: Pelvic exam: normal external genitalia, vulva, vagina, cervix, uterus and adnexa.  Musculoskeletal: Normal range of motion.  Neurological: She is alert and oriented to person, place, and time.  Skin: Skin is warm and dry.  Endo bx=simple hyperplasia without atypia.  Tissue suggestive of polyp  Recent Results (from the past 2160 hour(s))  CBC  Status: Abnormal   Collection Time: 02/12/17  8:45 AM  Result Value Ref Range   WBC 11.4 (H) 4.0 - 10.5 K/uL   RBC 4.49 3.87 - 5.11 MIL/uL   Hemoglobin 13.1 12.0 - 15.0 g/dL   HCT 59.1 63.8 - 46.6 %   MCV 89.5 78.0 - 100.0 fL   MCH 29.2 26.0 - 34.0 pg   MCHC 32.6 30.0 - 36.0 g/dL   RDW 59.9 35.7 - 01.7 %   Platelets 347 150 - 400 K/uL   Assessment/Plan: I have reviewed the patient's options for further evaluation of PMB  and have recommended hysteroscopy with curettage of the endometrial cavity as well as ppossible resection of abnormal endometrial lesion(s).  I have reviewed the  risks of anesthesia, bleeding, infection and damage to adjacent organs.  I have reviewed the particular risk of uterine perforation and the need to discontinue her procedure should that occur, as well as the measures that may be needed to address a perforation.  The patient wishes to proceed.  HAYGOOD,VANESSA P 02/16/2017, 7:23 AM

## 2017-02-16 ENCOUNTER — Encounter (HOSPITAL_COMMUNITY): Payer: Self-pay | Admitting: *Deleted

## 2017-02-16 ENCOUNTER — Encounter (HOSPITAL_COMMUNITY)
Admission: RE | Disposition: A | Discharge status: Home / Self Care | Payer: Self-pay | Source: Ambulatory Visit | Attending: Obstetrics and Gynecology

## 2017-02-16 ENCOUNTER — Ambulatory Visit (HOSPITAL_COMMUNITY): Admitting: Anesthesiology

## 2017-02-16 ENCOUNTER — Ambulatory Visit (HOSPITAL_COMMUNITY)
Admission: RE | Admit: 2017-02-16 | Discharge: 2017-02-16 | Disposition: A | Discharge status: Home / Self Care | Source: Ambulatory Visit | Attending: Obstetrics and Gynecology | Admitting: Obstetrics and Gynecology

## 2017-02-16 DIAGNOSIS — N840 Polyp of corpus uteri: Secondary | ICD-10-CM | POA: Insufficient documentation

## 2017-02-16 DIAGNOSIS — Z880 Allergy status to penicillin: Secondary | ICD-10-CM | POA: Insufficient documentation

## 2017-02-16 DIAGNOSIS — N950 Postmenopausal bleeding: Secondary | ICD-10-CM | POA: Insufficient documentation

## 2017-02-16 DIAGNOSIS — Z79899 Other long term (current) drug therapy: Secondary | ICD-10-CM | POA: Diagnosis not present

## 2017-02-16 DIAGNOSIS — F419 Anxiety disorder, unspecified: Secondary | ICD-10-CM | POA: Diagnosis not present

## 2017-02-16 HISTORY — PX: HYSTEROSCOPY WITH RESECTOSCOPE: SHX5395

## 2017-02-16 SURGERY — HYSTEROSCOPY, USING RESECTOSCOPE
Anesthesia: General | Site: Vagina

## 2017-02-16 MED ORDER — FENTANYL CITRATE (PF) 100 MCG/2ML IJ SOLN
INTRAMUSCULAR | Status: AC
  Administered 2017-02-16: 50 ug via INTRAVENOUS
  Filled 2017-02-16: qty 2

## 2017-02-16 MED ORDER — LIDOCAINE HCL 2 % IJ SOLN
INTRAMUSCULAR | Status: DC | PRN
  Administered 2017-02-16: 10 mL

## 2017-02-16 MED ORDER — DEXAMETHASONE SODIUM PHOSPHATE 4 MG/ML IJ SOLN
INTRAMUSCULAR | Status: AC
  Filled 2017-02-16: qty 1

## 2017-02-16 MED ORDER — PROMETHAZINE HCL 25 MG/ML IJ SOLN
6.2500 mg | INTRAMUSCULAR | Status: DC | PRN
Start: 2017-02-16 — End: 2017-02-16

## 2017-02-16 MED ORDER — FENTANYL CITRATE (PF) 100 MCG/2ML IJ SOLN
25.0000 ug | INTRAMUSCULAR | Status: DC | PRN
  Administered 2017-02-16: 50 ug via INTRAVENOUS

## 2017-02-16 MED ORDER — ONDANSETRON HCL 4 MG/2ML IJ SOLN
INTRAMUSCULAR | Status: AC
  Filled 2017-02-16: qty 2

## 2017-02-16 MED ORDER — KETOROLAC TROMETHAMINE 30 MG/ML IJ SOLN
INTRAMUSCULAR | Status: DC | PRN
  Administered 2017-02-16: 30 mg via INTRAVENOUS

## 2017-02-16 MED ORDER — LIDOCAINE HCL (CARDIAC) 20 MG/ML IV SOLN
INTRAVENOUS | Status: AC
  Filled 2017-02-16: qty 5

## 2017-02-16 MED ORDER — MIDAZOLAM HCL 2 MG/2ML IJ SOLN
INTRAMUSCULAR | Status: DC | PRN
  Administered 2017-02-16: 2 mg via INTRAVENOUS

## 2017-02-16 MED ORDER — PROPOFOL 10 MG/ML IV BOLUS
INTRAVENOUS | Status: AC
  Filled 2017-02-16: qty 20

## 2017-02-16 MED ORDER — SCOPOLAMINE 1 MG/3DAYS TD PT72
MEDICATED_PATCH | TRANSDERMAL | Status: AC
  Filled 2017-02-16: qty 1

## 2017-02-16 MED ORDER — SODIUM CHLORIDE 0.9 % IR SOLN
Status: DC | PRN
  Administered 2017-02-16: 3000 mL

## 2017-02-16 MED ORDER — FENTANYL CITRATE (PF) 250 MCG/5ML IJ SOLN
INTRAMUSCULAR | Status: AC
  Filled 2017-02-16: qty 5

## 2017-02-16 MED ORDER — LACTATED RINGERS IV SOLN
INTRAVENOUS | Status: DC
  Administered 2017-02-16: 08:00:00 via INTRAVENOUS
  Administered 2017-02-16: 1000 mL via INTRAVENOUS

## 2017-02-16 MED ORDER — IBUPROFEN 600 MG PO TABS: ORAL_TABLET | ORAL | 0 refills | Status: AC

## 2017-02-16 MED ORDER — KETOROLAC TROMETHAMINE 30 MG/ML IJ SOLN
INTRAMUSCULAR | Status: AC
  Filled 2017-02-16: qty 1

## 2017-02-16 MED ORDER — LIDOCAINE HCL (CARDIAC) 20 MG/ML IV SOLN
INTRAVENOUS | Status: DC | PRN
  Administered 2017-02-16: 80 mg via INTRAVENOUS

## 2017-02-16 MED ORDER — PROPOFOL 10 MG/ML IV BOLUS
INTRAVENOUS | Status: DC | PRN
  Administered 2017-02-16: 200 mg via INTRAVENOUS
  Administered 2017-02-16: 50 mg via INTRAVENOUS

## 2017-02-16 MED ORDER — MIDAZOLAM HCL 2 MG/2ML IJ SOLN
INTRAMUSCULAR | Status: AC
  Filled 2017-02-16: qty 2

## 2017-02-16 MED ORDER — SILVER NITRATE-POT NITRATE 75-25 % EX MISC
CUTANEOUS | Status: DC | PRN
  Administered 2017-02-16: 1 via TOPICAL

## 2017-02-16 MED ORDER — LIDOCAINE HCL 2 % IJ SOLN
INTRAMUSCULAR | Status: AC
Start: 2017-02-16 — End: ?
  Filled 2017-02-16: qty 20

## 2017-02-16 MED ORDER — DEXAMETHASONE SODIUM PHOSPHATE 4 MG/ML IJ SOLN
INTRAMUSCULAR | Status: DC | PRN
  Administered 2017-02-16: 4 mg via INTRAVENOUS

## 2017-02-16 MED ORDER — SCOPOLAMINE 1 MG/3DAYS TD PT72
1.0000 | MEDICATED_PATCH | Freq: Once | TRANSDERMAL | Status: DC
  Administered 2017-02-16: 1.5 mg via TRANSDERMAL

## 2017-02-16 MED ORDER — HYDROMORPHONE HCL 1 MG/ML IJ SOLN: 0.2500 mg | INTRAMUSCULAR | Status: DC | PRN

## 2017-02-16 MED ORDER — ONDANSETRON HCL 4 MG/2ML IJ SOLN
INTRAMUSCULAR | Status: DC | PRN
  Administered 2017-02-16: 4 mg via INTRAVENOUS

## 2017-02-16 MED ORDER — FENTANYL CITRATE (PF) 100 MCG/2ML IJ SOLN
INTRAMUSCULAR | Status: DC | PRN
  Administered 2017-02-16 (×3): 50 ug via INTRAVENOUS

## 2017-02-16 SURGICAL SUPPLY — 20 items
BIPOLAR CUTTING LOOP 21FR (ELECTRODE)
BOOTIES KNEE HIGH SLOAN (MISCELLANEOUS) ×6 IMPLANT
CANISTER SUCT 3000ML PPV (MISCELLANEOUS) ×3 IMPLANT
CATH ROBINSON RED A/P 16FR (CATHETERS) ×3 IMPLANT
CLOTH BEACON ORANGE TIMEOUT ST (SAFETY) ×3 IMPLANT
CONTAINER PREFILL 10% NBF 60ML (FORM) ×6 IMPLANT
DILATOR CANAL MILEX (MISCELLANEOUS) IMPLANT
ELECT COAG BIPOL BALL 21FR (ELECTRODE) IMPLANT
ELECT REM PT RETURN 9FT ADLT (ELECTROSURGICAL)
ELECTRODE REM PT RTRN 9FT ADLT (ELECTROSURGICAL) IMPLANT
GLOVE BIOGEL PI IND STRL 7.0 (GLOVE) ×1 IMPLANT
GLOVE BIOGEL PI INDICATOR 7.0 (GLOVE) ×2
GLOVE SURG SS PI 6.5 STRL IVOR (GLOVE) ×3 IMPLANT
GOWN STRL REUS W/TWL LRG LVL3 (GOWN DISPOSABLE) ×6 IMPLANT
LOOP CUTTING BIPOLAR 21FR (ELECTRODE) IMPLANT
PACK VAGINAL MINOR WOMEN LF (CUSTOM PROCEDURE TRAY) ×3 IMPLANT
PAD OB MATERNITY 4.3X12.25 (PERSONAL CARE ITEMS) ×3 IMPLANT
TOWEL OR 17X24 6PK STRL BLUE (TOWEL DISPOSABLE) ×6 IMPLANT
TUBING AQUILEX INFLOW (TUBING) ×3 IMPLANT
TUBING AQUILEX OUTFLOW (TUBING) ×3 IMPLANT

## 2017-02-16 NOTE — Anesthesia Procedure Notes (Signed)
Date/Time: 02/16/2017 7:34 AM Performed by: Elbert Ewings Pre-anesthesia Checklist: Patient identified, Emergency Drugs available, Suction available and Patient being monitored Patient Re-evaluated:Patient Re-evaluated prior to induction Oxygen Delivery Method: Circle system utilized Preoxygenation: Pre-oxygenation with 100% oxygen Induction Type: IV induction LMA: LMA with gastric port inserted LMA Size: 4.0 Number of attempts: 1

## 2017-02-16 NOTE — Anesthesia Postprocedure Evaluation (Signed)
Anesthesia Post Note  Patient: Brenda Bishop  Procedure(s) Performed: Procedure(s) (LRB): DILATION AND CURETTAGE, HYSTEROSCOPY (N/A)     Patient location during evaluation: PACU Anesthesia Type: General Level of consciousness: sedated Pain management: pain level controlled Vital Signs Assessment: post-procedure vital signs reviewed and stable Respiratory status: spontaneous breathing and respiratory function stable Cardiovascular status: stable Anesthetic complications: no    Last Vitals:  Vitals:   02/16/17 0930 02/16/17 0940  BP: (!) 153/78 (!) 146/73  Pulse: 75 77  Resp: 15 14  Temp:    SpO2: 94% 94%    Last Pain:  Vitals:   02/16/17 0940  TempSrc:   PainSc: 0-No pain   Pain Goal: Patients Stated Pain Goal: 4 (02/16/17 0618)               Heather Roberts DANIEL

## 2017-02-16 NOTE — Discharge Instructions (Signed)
Hysteroscopy, Care After  Refer to this sheet in the next few weeks. These instructions provide you with information on caring for yourself after your procedure. Your health care provider may also give you more specific instructions. Your treatment has been planned according to current medical practices, but problems sometimes occur. Call your health care provider if you have any problems or questions after your procedure.  What can I expect after the procedure?  After your procedure, it is typical to have the following:  · You may have some cramping. This normally lasts for a couple days.  · You may have bleeding. This can vary from light spotting for a few days to menstrual-like bleeding for 3-7 days.    Follow these instructions at home:  · Rest for the first 1-2 days after the procedure.  · Only take over-the-counter or prescription medicines as directed by your health care provider. Do not take aspirin. It can increase the chances of bleeding.  · Take showers instead of baths for 2 weeks or as directed by your health care provider.  · Do not drive for 24 hours or as directed.  · Do not drink alcohol while taking pain medicine.  · Do not use tampons, douche, or have sexual intercourse for 2 weeks or until your health care provider says it is okay.  · Take your temperature twice a day for 4-5 days. Write it down each time.  · Follow your health care provider's advice about diet, exercise, and lifting.  · If you develop constipation, you may:  ? Take a mild laxative if your health care provider approves.  ? Add bran foods to your diet.  ? Drink enough fluids to keep your urine clear or pale yellow.  · Try to have someone with you or available to you for the first 24-48 hours, especially if you were given a general anesthetic.  · Follow up with your health care provider as directed.  Contact a health care provider if:  · You feel dizzy or lightheaded.  · You feel sick to your stomach (nauseous).  · You have  abnormal vaginal discharge.  · You have a rash.  · You have pain that is not controlled with medicine.  Get help right away if:  · You have bleeding that is heavier than a normal menstrual period.  · You have a fever.  · You have increasing cramps or pain, not controlled with medicine.  · You have new belly (abdominal) pain.  · You pass out.  · You have pain in the tops of your shoulders (shoulder strap areas).  · You have shortness of breath.  This information is not intended to replace advice given to you by your health care provider. Make sure you discuss any questions you have with your health care provider.  Document Released: 04/05/2013 Document Revised: 11/21/2015 Document Reviewed: 01/12/2013  Elsevier Interactive Patient Education © 2017 [employer]

## 2017-02-16 NOTE — Transfer of Care (Signed)
Immediate Anesthesia Transfer of Care Note  Patient: Brenda Bishop  Procedure(s) Performed: Procedure(s): DILATION AND CURETTAGE, HYSTEROSCOPY (N/A)  Patient Location: PACU  Anesthesia Type:General  Level of Consciousness: awake, alert  and oriented  Airway & Oxygen Therapy: Patient Spontanous Breathing  Post-op Assessment: Report given to RN and Post -op Vital signs reviewed and stable  Post vital signs: Reviewed and stable BP 161/71, HR 88, RR12, SaO2 94%  Last Vitals:  Vitals:   02/16/17 0618  BP: (!) 168/79  Pulse: 85  Resp: 16  Temp: 36.8 C  SpO2: 95%    Last Pain:  Vitals:   02/16/17 0618  TempSrc: Oral      Patients Stated Pain Goal: 4 (02/16/17 0618)  Complications: No apparent anesthesia complications

## 2017-02-16 NOTE — Op Note (Signed)
02/16/2017  8:29 AM  PATIENT:  Brenda Bishop  61 y.o. female  PRE-OPERATIVE DIAGNOSIS:  Post Menopausal Bleeding  POST-OPERATIVE DIAGNOSIS:  Post Menopausal Bleeding  PROCEDURE:  Procedure(s): DILATION AND CURETTAGE, HYSTEROSCOPY  SURGEON:  Aloma Boch P, MD  ASSISTANTS: None  ANESTHESIA:   general  ESTIMATED BLOOD LOSS: Less than 10 cc  BLOOD ADMINISTERED:none  COMPLICATIONS: None  FINDINGS: The uterus sounded to 7 cm. The endometrial cavity was atrophic and both tubal ostia were visualized. There was on the left lateral wall and endometrial polyp measuring approximately 5 mm. The endocervical canal was free of lesions.  FLUID DEFICIT: 85 cc  LOCAL MEDICATIONS USED:  LIDOCAINE  and Amount: 10 ml  SPECIMEN:  Source of Specimen:  #1. Endocervical curettings #2. endometrial polyp with endometrial curettings  DISPOSITION OF SPECIMEN:  PATHOLOGY  COUNTS:  YES  DESCRIPTION OF PROCEDURE:the patient was taken to the operating room after appropriate identification and placed on the operating table. After the attainment of adequate general anesthesia she was placed in the lithotomy position. The perineum and vagina were prepped with multiple layers of Betadine. The bladder was emptied with a an in and out catheter. The perineum was draped in sterile field. A Graves speculum was placed in the vagina. The cervix was grasped with a single-tooth tenaculum. A paracervical block was achieved with a total of 10 cc of 2% Xylocaine and the 5 and 7:00 positions. The uterus was sounded.  The cervix was then dilated to accommodate the diagnostic hysteroscope. The hysteroscope was used to evaluate all quadrants of the uterus. The above findings were noted and documented. The endocervix was curetted with minimal curettings obtained. The endometrium was then curetted in the endometrial polyp retrieved with Randall stone forceps. Repeat hysteroscopy documented that the endometrial polyp had been  removed. All instruments were then removed from the vagina and the patient was awakened from general anesthesia and taken to the recovery room in satisfactory condition having tolerated the procedure well sponge and instrument counts correct.  PLAN OF CARE: Discharge home after postanesthesia care  PATIENT DISPOSITION:  PACU - hemodynamically stable.   Delay start of Pharmacological VTE agent (>24hrs) due to surgical blood loss or risk of bleeding:  Yes.  SCD hose were used during the procedure   [name], MD 8:29 AM

## 2017-02-17 ENCOUNTER — Encounter (HOSPITAL_COMMUNITY): Payer: Self-pay | Admitting: Obstetrics and Gynecology

## 2017-02-17 NOTE — Addendum Note (Signed)
Addendum  created 02/17/17 1232 by Algis Greenhouse, CRNA   Charge Capture section accepted

## 2018-03-18 ENCOUNTER — Encounter: Payer: Self-pay | Admitting: Cardiology

## 2018-03-22 NOTE — Progress Notes (Signed)
Cardiology Office Note:    Date:  03/23/2018   ID:  Brenda Bishop, DOB Aug 08, 1955, MRN [id number]  PCP:  Sigmund Hazel, MD  Cardiologist:  Jodelle Red, MD PhD  Referring MD: Sigmund Hazel, MD   Chief Complaint  Patient presents with  . Follow-up    Jaw pain.  Marland Kitchen Shortness of Breath  . Chest Pain   History of Present Illness:    Brenda Bishop is a 62 y.o. female with a hx of anxiety, obesity, arthritis who is seen as a new consult at the request of Sigmund Hazel, MD for the evaluation and management of jaw pain.  Per records received from [place] at [facility], she was traveling to [place] and noted bilateral mild jaw pain while walking to car and then driving in car. Lasted about an hour. The following day, she was walking and had no pain with exertion. The following day the mild pain returned, but it did not recur after that. No chest pain or tightness. It did not clearly relate to her exertion. No shortness of breath during these events, noted that shortness of breath occurred previously while walking 3 outside flights of stairs in the heat in [place] (on 9/9).  Patient concerns today: wants to avoid unnecessary tests, prefers to try lifestyle over medication. Shortness of breath with exertion, has a pulse ox at home, has seen it drop to 85-90 when ambulating.   Jaw pain: -Initial onset: with recent trip, none prior -Quality: ache, bilateral jaws -Frequency: as above -Duration: as above -Aggravating/alleviating factors: since starting Prilosec on 9/21, no further pain on lying down -Prior cardiac history: none -Prior ECG: normal -Prior workup: none -Prior treatment: none -Comorbidities: obesity. Gained weight after knee injuries in the past.  -Exercise level: walks a lot for her job -Labs: TSH , kidney function/electrolytes , CBC . -Cardiac ROS: no shortness of breath, no PND, no orthopnea, no LE edema, no syncope  Family history: father had HTN, CVA, CAD, prior  heart surgery. Mother had EtOH abuse, breast cancer, COPD, anxiety/depression. Brothers without heart issues, do have HTN. Maternal GMA died from CHF. Social history: never smoker, occasional alcohol. Married, has children. Works as a Engineer, site. Diet: drinks regular coke, has been trying to cut back to coke zero. Working on cutting fat out of her diet.   Past Medical History:  Diagnosis Date  . Allergy   . Anxiety   . Arthritis    bilateral knees    Past Surgical History:  Procedure Laterality Date  . CESAREAN SECTION    . HYSTEROSCOPY WITH RESECTOSCOPE N/A 02/16/2017   Procedure: DILATION AND CURETTAGE, HYSTEROSCOPY;  Surgeon: [name], MD;  Location: [facility];  Service: Gynecology;  Laterality: N/A;  . KNEE ARTHROSCOPY Left     Current Medications: Current Outpatient Medications on File Prior to Visit  Medication Sig  . diphenhydramine-acetaminophen (TYLENOL PM) 25-500 MG TABS Take 2 tablets by mouth at bedtime.   Marland Kitchen escitalopram (LEXAPRO) 10 MG tablet Take 10 mg by mouth at bedtime.  Marland Kitchen ibuprofen (ADVIL,MOTRIN) 600 MG tablet Ibuprofen 600 mg orally every 6 hours for 3 days, then orally every 6 hours as needed for pain  . mirtazapine (REMERON) 15 MG tablet Take 15 mg by mouth at bedtime.  Marland Kitchen omeprazole (PRILOSEC) 20 MG capsule Take 20 mg by mouth daily.   No current facility-administered medications on file prior to visit.      Allergies:   Penicillins   Social History  Socioeconomic History  . Marital status: Married    Spouse name: Not on file  . Number of children: Not on file  . Years of education: Not on file  . Highest education level: Not on file  Occupational History  . Not on file  Social Needs  . Financial resource strain: Not on file  . Food insecurity:    Worry: Not on file    Inability: Not on file  . Transportation needs:    Medical: Not on file    Non-medical: Not on file  Tobacco Use  . Smoking status: Never Smoker  . Smokeless  tobacco: Never Used  Substance and Sexual Activity  . Alcohol use: Yes    Comment: occ  . Drug use: No  . Sexual activity: Not on file  Lifestyle  . Physical activity:    Days per week: Not on file    Minutes per session: Not on file  . Stress: Not on file  Relationships  . Social connections:    Talks on phone: Not on file    Gets together: Not on file    Attends religious service: Not on file    Active member of club or organization: Not on file    Attends meetings of clubs or organizations: Not on file    Relationship status: Not on file  Other Topics Concern  . Not on file  Social History Narrative  . Not on file     Family History: The patient's family history includes COPD in her mother; Cancer in her mother; Diabetes in her father; Heart disease in her father; Hyperlipidemia in her father; Hypertension in her father; Stroke in her father.  ROS:   Please see the history of present illness.  Additional pertinent ROS: Review of Systems  Constitutional: Negative for chills and fever.  HENT: Negative for ear pain and hearing loss.   Eyes: Negative for blurred vision and pain.  Respiratory: Positive for shortness of breath. Negative for sputum production.   Cardiovascular: Negative for chest pain, palpitations, orthopnea, claudication, leg swelling and PND.  Gastrointestinal: Negative for abdominal pain, blood in stool and melena.  Genitourinary: Negative for dysuria and hematuria.  Musculoskeletal: Positive for joint pain. Negative for falls.  Skin: Negative for rash.  Neurological: Negative for sensory change, focal weakness and loss of consciousness.  Endo/Heme/Allergies: Does not bruise/bleed easily.   EKGs/Labs/Other Studies Reviewed:    The following studies were reviewed today: Prior notes and labs  EKG:  EKG is ordered today.  The ekg ordered today demonstrates normal sinus rhythm  Recent Labs: No results found for requested labs within last 8760 hours.    Recent Lipid Panel    Component Value Date/Time   CHOL  01/01/2010 0545    170        ATP III CLASSIFICATION:  <200     mg/dL   Desirable  161-096200-239  mg/dL   Borderline High  >=045>=240    mg/dL   High          TRIG 65 01/01/2010 0545   HDL 50 01/01/2010 0545   CHOLHDL 3.4 01/01/2010 0545   VLDL 13 01/01/2010 0545   LDLCALC (H) 01/01/2010 0545    107        Total Cholesterol/HDL:CHD Risk Coronary Heart Disease Risk Table                     Men   Women  1/2 Average Risk   3.4  3.3  Average Risk       5.0   4.4  2 X Average Risk   9.6   7.1  3 X Average Risk  23.4   11.0        Use the calculated Patient Ratio above and the CHD Risk Table to determine the patient's CHD Risk.        ATP III CLASSIFICATION (LDL):  <100     mg/dL   Optimal  784-696  mg/dL   Near or Above                    Optimal  130-159  mg/dL   Borderline  295-284  mg/dL   High  >132     mg/dL   Very High    Physical Exam:    VS:  BP 120/74 (BP Location: Left Arm, Patient Position: Sitting, Cuff Size: Large)   Pulse 84   Ht 5\' 3"  (1.6 m)   Wt 242 lb (109.8 kg)   BMI 42.87 kg/m     Wt Readings from Last 3 Encounters:  03/23/18 242 lb (109.8 kg)  02/12/17 243 lb 8 oz (110.5 kg)  10/12/13 224 lb (101.6 kg)     GEN: Well nourished, well developed in no acute distress HEENT: Normal NECK: No JVD; No carotid bruits LYMPHATICS: No lymphadenopathy CARDIAC: regular rhythm, normal S1 and S2, no murmurs, rubs, gallops. Radial and DP pulses 2+ bilaterally. RESPIRATORY:  Clear to auscultation without rales, wheezing or rhonchi  ABDOMEN: Soft, non-tender, non-distended MUSCULOSKELETAL:  No edema; No deformity  SKIN: Warm and dry NEUROLOGIC:  Alert and oriented x 3 PSYCHIATRIC:  Normal affect   ASSESSMENT:    1. Jaw pain   2. Hyperlipidemia, unspecified hyperlipidemia type   3. DOE (dyspnea on exertion)   4. Counseling on health promotion and disease prevention    PLAN:    1. Jaw pain,  hyperlipidemia, dyspnea on exertion -concern that jaw pain and dyspnea on exertion may be her anginal equivalent. We discussed multiple options, including CT coronary angiography and stress testing. She wants to think about additional testing and will get back in touch with [place] to let us know what she wishes to pursue.  2. Prevention counseling ASCVD risk 3.2% We discussed further risk stratification with CRP and coronary calcium score as well. She is amenable to updating her lipids and getting a hs-CRP for further risk stratification. She will consider coronary calcium score (which we would obtain with the coronary CTA above if she so chooses). -lipid panel, hs-CRP Prevention: -recommend heart healthy/Mediterranean diet, with whole grains, fruits, vegetable, fish, lean meats, nuts, and olive oil. Limit salt. -recommend moderate walking, 3-5 times/week for 30-50 minutes each session. Aim for at least 150 minutes.week. Goal should be pace of 3 miles/hours, or walking 1.5 miles in 30 minutes -recommend avoidance of tobacco products. Avoid excess alcohol. -Additional risk factor control:  -Diabetes: A1c is not available, recommend PCP screening  -Lipids: mst recemt 08/2015: Tchol 183, LDL 114, HDL 50, T 94  -Blood pressure control: at goal, not on medications  -Weight: BMI nearly 43. She is motivated to lose weight. We discussed diet and exercise for weight loss, and she will consider what additional help she needs to be successful.  Plan for follow up: as needed, based on results of above testing  Medication Adjustments/Labs and Tests Ordered: Current medicines are reviewed at length with the patient today.  Concerns regarding medicines are outlined above.  Orders Placed This Encounter  Procedures  . Lipid panel  . CRP High sensitivity  . EKG 12-Lead   No orders of the defined types were placed in this encounter.   Patient Instructions  Medication Instructions:  Your physician recommends  that you continue on your current medications as directed. Please refer to the Current Medication list given to you today.  Labwork: LP/CRP SOON   Testing/Procedures: NONE  Follow-Up: AS NEEDED      Signed, Jodelle Red, MD PhD 03/23/2018 4:57 PM    [facility]

## 2018-03-23 ENCOUNTER — Encounter: Payer: Self-pay | Admitting: Cardiology

## 2018-03-23 ENCOUNTER — Ambulatory Visit (INDEPENDENT_AMBULATORY_CARE_PROVIDER_SITE_OTHER): Admitting: Cardiology

## 2018-03-23 VITALS — BP 120/74 | HR 84 | Ht 63.0 in | Wt 242.0 lb

## 2018-03-23 DIAGNOSIS — E785 Hyperlipidemia, unspecified: Secondary | ICD-10-CM | POA: Diagnosis not present

## 2018-03-23 DIAGNOSIS — R0609 Other forms of dyspnea: Secondary | ICD-10-CM | POA: Diagnosis not present

## 2018-03-23 DIAGNOSIS — R6884 Jaw pain: Secondary | ICD-10-CM | POA: Diagnosis not present

## 2018-03-23 DIAGNOSIS — Z7189 Other specified counseling: Secondary | ICD-10-CM | POA: Diagnosis not present

## 2018-03-23 NOTE — Patient Instructions (Signed)
Medication Instructions:  Your physician recommends that you continue on your current medications as directed. Please refer to the Current Medication list given to you today.  Labwork: LP/CRP SOON   Testing/Procedures: NONE  Follow-Up: AS NEEDED

## 2018-03-24 LAB — LIPID PANEL
CHOL/HDL RATIO: 3.7 ratio (ref 0.0–4.4)
Cholesterol, Total: 189 mg/dL (ref 100–199)
HDL: 51 mg/dL (ref >39)
LDL CALC: 117 mg/dL — AB (ref 0–99)
Triglycerides: 107 mg/dL (ref 0–149)
VLDL Cholesterol Cal: 21 mg/dL (ref 5–40)

## 2018-03-24 LAB — HIGH SENSITIVITY CRP: CRP, High Sensitivity: 18.38 mg/L — ABNORMAL HIGH (ref 0.00–3.00)

## 2018-04-14 ENCOUNTER — Telehealth: Payer: Self-pay | Admitting: *Deleted

## 2018-04-14 NOTE — Telephone Encounter (Signed)
received call from Darl Pikes with Dr. Sigmund Hazel office at [facility] states this patient was seen today and the patient continues to experience jaw pain and Dr. Hyacinth Meeker is requesting a CT scan.   The patient is under impression that no further workup was recommended.   Advised per OV:   1. Jaw pain, hyperlipidemia, dyspnea on exertion -concern that jaw pain and dyspnea on exertion may be her anginal equivalent. We discussed multiple options, including CT coronary angiography and stress testing. She wants to think about additional testing and will get back in touch with [place] to let us know what she wishes to pursue.   Also result note: Notes recorded by Jodelle Red, MD on 03/28/2018 at 11:40 AM EDT The lipid panel does show good levels of HDL (good cholesterol) and borderline levels of LDL (bad cholesterol). However, your inflammatory marker is very high--higher even than what we typically see for elevated cardiac risk. I recommend you follow up with your primary care doctor for this. The level is what we see for infection, inflammatory diseases, etc. It will need some additional workup to determine the best course of treatment. Elevated inflammatory markers in the longer term do increase cardiac risk. We had discussed next steps to evaluate the jaw pain. If you can't find a cause with your primary care doctor (ask them specifically to check for temporal arteritis), please let me know if you'd like to consider further cardiac testing.   Advised patient was instructed to call when she wished to persue and also follow up with PCP to discuss elevated CRP.   Darl Pikes is aware and states patient misunderstood but is requesting to have coronary CTA.    Advised would route to MD to review and advise on order.   Requested workup through PCP for elevated CRP.   Darl Pikes is aware and will make Dr. Hyacinth Meeker aware of this.

## 2018-04-18 NOTE — Telephone Encounter (Signed)
We can order the coronary CTA. The workup she will need through her PCP is different; she will need an evaluation for vasculitis such as temporal arteritis. The coronary CTA takes time to schedule, so she should discuss the vasculitis workup with her PCP in the interim. Thank you.

## 2018-04-18 NOTE — Telephone Encounter (Signed)
Pt updated with Dr. Di Kindle recommendation. Pt states she is going to hold off because she was told that Dr. Hyacinth Meeker would like to speak with Dr. Cristal Deer. Will route to MD

## 2018-04-18 NOTE — Telephone Encounter (Signed)
  Dr Rondel Baton nurse called to give Dr. Cristal Deer her cell number to reach her directly. Dr Hyacinth Meeker will be in the office 8:30-2:30 Tuesday, cell # [phone]

## 2018-04-18 NOTE — Telephone Encounter (Signed)
FYI Alisha :)

## 2018-04-19 NOTE — Telephone Encounter (Signed)
I spoke with Dr. Hyacinth Meeker by phone. She was not concerned for temporal arteritis or other vasculitic cause. She thinks that the patient should have a cardiac test to evaluate for this as an anginal equivalent. I discussed testing options with Brenda Bishop in depth at our visit, and I agree that either coronary CTA (though this does have a significant wait time) or treadmill nuclear would be the best options for her. If she is amenable now we can order testing. Thank you.

## 2018-04-19 NOTE — Telephone Encounter (Signed)
Spoke with pt and informed that Dr. Cristal Deer and Dr. Hyacinth Meeker discussed further and per Dr. Hyacinth Meeker, she would like for pt to have additional cardiac workup. Pt asked," is this something Dr. Cristal Deer feels is mandatory or leaving it as an option?" Informed pt that she is providing her with options of testing that could be done. Pt got upset stating she feels its poor communication, she's getting mixed signals and states she didn't want to order a lot of test if it wasn't mandatory. Advised that per last OV  Jaw pain, hyperlipidemia, dyspnea on exertion -concern that jaw pain and dyspnea on exertion may be her anginal equivalent. We discussed multiple options, including CT coronary angiography and stress testing. She wants to think about additional testing and will get back in touch with [place] to let us know what she wishes to pursue.    Pt states she still wants to think about it as she is not happy about the communication part. Pt offered an appointment to discuss but pt declined and states she will think about it.

## 2018-04-20 ENCOUNTER — Telehealth: Payer: Self-pay | Admitting: Cardiology

## 2018-04-20 NOTE — Telephone Encounter (Signed)
New message  Per Britta Mccreedy at Dr. Rondel Baton Office, patient would like to be released from Dr. Di Kindle care and would like to see Dr. Eldridge Dace going forward. Please advise.

## 2018-04-20 NOTE — Telephone Encounter (Signed)
Please inform the patient that she has a very good cardiologist.  My office availability is much more limited than Dr. Cristal Deer.  I can be available if procedures are needed.  If she still wants to switch, that is fine- I will accept, but after I reviewed her note with Dr. Cristal Deer, I think the plan going forward would be the same.

## 2018-04-20 NOTE — Telephone Encounter (Signed)
Ok. Thank you for the information.  ?

## 2018-04-21 NOTE — Telephone Encounter (Signed)
Left message for patient to call back  

## 2018-04-22 NOTE — Telephone Encounter (Signed)
Patient returning call. Made patient aware that Dr. Eldridge Dace states that she has a very good cardiologist. Made her aware that his office availability is much more limited than Dr. Di Kindle.  Made her aware that he could be available if procedures are needed. Made patient aware he reviewed her note with Dr. Cristal Deer and feels the plan going forward would be the same. Patient is still insistent on switching. Scheduled patient for next available appointment on 07/12/18 at 3:40 PM. Patient will let us know if her Sx change before then and she will come in to see APP.

## 2018-07-12 ENCOUNTER — Ambulatory Visit: Admitting: Interventional Cardiology

## 2020-08-28 ENCOUNTER — Other Ambulatory Visit: Payer: Self-pay | Admitting: Obstetrics & Gynecology

## 2020-08-28 DIAGNOSIS — Z1231 Encounter for screening mammogram for malignant neoplasm of breast: Secondary | ICD-10-CM

## 2020-11-13 ENCOUNTER — Ambulatory Visit
